# Patient Record
Sex: Male | Born: 2000 | Race: White | Hispanic: No | Marital: Single | Smoking: Former smoker
Health system: Southern US, Community
[De-identification: ages and names within clinical notes are randomized; demographics above are authoritative.]

---
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
## 2021-10-28 ENCOUNTER — Emergency Department
Admission: EM | Admit: 2021-10-28 | Discharge: 2021-10-28 | Disposition: A | Discharge status: Home / Self Care | Payer: Managed Care, Other (non HMO) | Attending: Emergency Medicine | Admitting: Emergency Medicine

## 2021-10-28 ENCOUNTER — Encounter: Payer: Self-pay | Admitting: Emergency Medicine

## 2021-10-28 ENCOUNTER — Emergency Department: Payer: Managed Care, Other (non HMO)

## 2021-10-28 ENCOUNTER — Other Ambulatory Visit: Payer: Self-pay

## 2021-10-28 DIAGNOSIS — R110 Nausea: Secondary | ICD-10-CM | POA: Diagnosis not present

## 2021-10-28 DIAGNOSIS — N50812 Left testicular pain: Secondary | ICD-10-CM | POA: Diagnosis not present

## 2021-10-28 DIAGNOSIS — N200 Calculus of kidney: Secondary | ICD-10-CM

## 2021-10-28 LAB — CBC WITH DIFFERENTIAL/PLATELET
Abs Immature Granulocytes: 0.01 10*3/uL (ref 0.00–0.07)
Basophils Absolute: 0.1 10*3/uL (ref 0.0–0.1)
Basophils Relative: 1 %
Eosinophils Absolute: 0.1 10*3/uL (ref 0.0–0.5)
Eosinophils Relative: 1 %
HCT: 44.8 % (ref 39.0–52.0)
Hemoglobin: 15.2 g/dL (ref 13.0–17.0)
Immature Granulocytes: 0 %
Lymphocytes Relative: 31 %
Lymphs Abs: 2.3 10*3/uL (ref 0.7–4.0)
MCH: 29.2 pg (ref 26.0–34.0)
MCHC: 33.9 g/dL (ref 30.0–36.0)
MCV: 86 fL (ref 80.0–100.0)
Monocytes Absolute: 0.5 10*3/uL (ref 0.1–1.0)
Monocytes Relative: 7 %
Neutro Abs: 4.6 10*3/uL (ref 1.7–7.7)
Neutrophils Relative %: 60 %
Platelets: 256 10*3/uL (ref 150–400)
RBC: 5.21 MIL/uL (ref 4.22–5.81)
RDW: 12.1 % (ref 11.5–15.5)
WBC: 7.6 10*3/uL (ref 4.0–10.5)
nRBC: 0 % (ref 0.0–0.2)

## 2021-10-28 LAB — CHLAMYDIA/NGC RT PCR (ARMC ONLY)
Chlamydia Tr: NOT DETECTED
N gonorrhoeae: NOT DETECTED

## 2021-10-28 LAB — BASIC METABOLIC PANEL
Anion gap: 11 (ref 5–15)
BUN: 15 mg/dL (ref 6–20)
CO2: 22 mmol/L (ref 22–32)
Calcium: 10.2 mg/dL (ref 8.9–10.3)
Chloride: 107 mmol/L (ref 98–111)
Creatinine, Ser: 1.13 mg/dL (ref 0.61–1.24)
GFR, Estimated: >60 mL/min (ref >60)
Glucose, Bld: 145 mg/dL — ABNORMAL HIGH (ref 70–99)
Potassium: 3.9 mmol/L (ref 3.5–5.1)
Sodium: 140 mmol/L (ref 135–145)

## 2021-10-28 LAB — URINALYSIS, ROUTINE W REFLEX MICROSCOPIC
Bacteria, UA: NONE SEEN
Bilirubin Urine: NEGATIVE
Glucose, UA: NEGATIVE mg/dL
Ketones, ur: NEGATIVE mg/dL
Leukocytes,Ua: NEGATIVE
Nitrite: NEGATIVE
Protein, ur: 30 mg/dL — AB
RBC / HPF: >50 RBC/hpf — ABNORMAL HIGH (ref 0–5)
Specific Gravity, Urine: 1.021 (ref 1.005–1.030)
pH: 9 — ABNORMAL HIGH (ref 5.0–8.0)

## 2021-10-28 MED ORDER — KETOROLAC TROMETHAMINE 15 MG/ML IJ SOLN
15.0000 mg | Freq: Once | INTRAMUSCULAR | Status: AC
  Administered 2021-10-28: 15 mg via INTRAVENOUS
  Filled 2021-10-28: qty 1

## 2021-10-28 MED ORDER — OXYCODONE HCL 5 MG PO TABS
5.0000 mg | ORAL_TABLET | Freq: Once | ORAL | Status: AC
  Administered 2021-10-28: 5 mg via ORAL
  Filled 2021-10-28: qty 1

## 2021-10-28 MED ORDER — ONDANSETRON HCL 4 MG/2ML IJ SOLN
4.0000 mg | Freq: Once | INTRAMUSCULAR | Status: AC
Start: 2021-10-28 — End: 2021-10-28
  Administered 2021-10-28: 4 mg via INTRAVENOUS
  Filled 2021-10-28: qty 2

## 2021-10-28 MED ORDER — HYDROMORPHONE HCL 1 MG/ML IJ SOLN
0.5000 mg | Freq: Once | INTRAMUSCULAR | Status: AC
  Administered 2021-10-28: 0.5 mg via INTRAVENOUS
  Filled 2021-10-28: qty 0.5

## 2021-10-28 MED ORDER — TAMSULOSIN HCL 0.4 MG PO CAPS: 0.4000 mg | ORAL_CAPSULE | Freq: Every day | ORAL | 0 refills | Status: AC

## 2021-10-28 MED ORDER — SODIUM CHLORIDE 0.9 % IV BOLUS
1000.0000 mL | Freq: Once | INTRAVENOUS | Status: AC
  Administered 2021-10-28: 1000 mL via INTRAVENOUS

## 2021-10-28 MED ORDER — DROPERIDOL 2.5 MG/ML IJ SOLN
1.2500 mg | Freq: Once | INTRAMUSCULAR | Status: AC
  Administered 2021-10-28: 1.25 mg via INTRAVENOUS
  Filled 2021-10-28: qty 2

## 2021-10-28 MED ORDER — ONDANSETRON 4 MG PO TBDP: 4.0000 mg | ORAL_TABLET | Freq: Three times a day (TID) | ORAL | 0 refills | Status: AC | PRN

## 2021-10-28 MED ORDER — OXYCODONE HCL 5 MG PO TABS: 5.0000 mg | ORAL_TABLET | Freq: Four times a day (QID) | ORAL | 0 refills | Status: AC | PRN

## 2021-10-28 NOTE — Discharge Instructions (Addendum)
Follow up Friday at 115 with urology Dr Apolinar Junes at her [facility] location    You have a kidney stone. See report below.   Take ibuprofen 600mg  every 8 hours daily (as long as you are not on any other blood thinners or have kidney disease) Take tylenol 1g every 8 hours daily. Take oxycodone for breakthrough pain. Do not drive, work, or operate machinery while on this.  Take zofran to help with nausea. Take Flomax to help dilate uretha. Call urology number above to schedule outpatient appointment. Return to ED for fevers, unable to keep food down, or any other concerns.     Take oxycodone as prescribed. Do not drink alcohol, drive or participate in any other potentially dangerous activities while taking this medication as it may make you sleepy. Do not take this medication with any other sedating medications, either prescription or over-the-counter. If you were prescribed Percocet or Vicodin, do not take these with acetaminophen (Tylenol) as it is already contained within these medications.  This medication is an opiate (or narcotic) pain medication and can be habit forming. Use it as little as possible to achieve adequate pain control. Do not use or use it with extreme caution if you have a history of opiate abuse or dependence. If you are on a pain contract with your primary care doctor or a pain specialist, be sure to let them know you were prescribed this medication today from the Emergency Department. This medication is intended for your use only - do not give any to anyone else and keep it in a secure place where nobody else, especially children, have access to it.    IMPRESSION: 1. 3-4 mm proximal to mid LEFT ureteral calculus with mild fullness of intrarenal collecting systems and ureter without current frank signs of hydronephrosis. Also associated with mild perinephric stranding. Correlate with urinalysis 2. Tiny 2 mm calculus also in the upper pole of the LEFT kidney.

## 2021-10-28 NOTE — ED Notes (Signed)
Dr. Sung at the bedside °

## 2021-10-28 NOTE — ED Triage Notes (Signed)
Patient ambulatory to triage with steady gait, without difficulty, appears uncomfortable; pt reports testicular pain this morning with no accomp symptoms; denies hx of same

## 2021-10-28 NOTE — ED Notes (Signed)
US at the bedside

## 2021-10-28 NOTE — ED Provider Notes (Signed)
[facility] Provider Note    Event Date/Time   First MD Initiated Contact with Patient 10/28/21 [phone]     (approximate)   History   Testicle Pain   HPI  Mike Sparks is a 21 y.o. male presents to the ED accompanied by his mother with a chief complaint of left testicular pain.  Patient awoke with pain this morning.  Associated with nausea.  Denies recent illness or trauma.  Denies fever, chills, chest pain, shortness of breath, flank/abdominal pain, hematuria/dysuria, vomiting.     Past Medical History  History reviewed. No pertinent past medical history.   Active Problem List  There are no problems to display for this patient.    Past Surgical History  History reviewed. No pertinent surgical history.   Home Medications   Prior to Admission medications   Not on File     Allergies  Patient has no known allergies.   Family History  No family history on file.   Physical Exam  Triage Vital Signs: ED Triage Vitals  Enc Vitals Group     BP 10/28/21 0645 (!) 138/92     Pulse Rate 10/28/21 0645 66     Resp 10/28/21 0645 (!) 24     Temp 10/28/21 0645 (!) 96.9 F (36.1 C)     Temp Source 10/28/21 0645 Oral     SpO2 10/28/21 0645 99 %     Weight 10/28/21 0631 285 lb (129.3 kg)     Height 10/28/21 0631 6\' 5"  (1.956 m)     Head Circumference --      Peak Flow --      Pain Score 10/28/21 0631 10     Pain Loc --      Pain Edu? --      Excl. in GC? --     Updated Vital Signs: BP (!) 138/92 (BP Location: Left Arm)   Pulse 66   Temp (!) 96.9 F (36.1 C) (Oral)   Resp (!) 24   Ht 6\' 5"  (1.956 m)   Wt 129.3 kg   SpO2 99%   BMI 33.80 kg/m    General: Awake, moderate distress.  CV:  RRR.  Good peripheral perfusion.  Resp:  Increased effort.  CTAB. Abd:  Nontender to light or deep palpation.  No CVAT.  No vesicles.  No distention.  Other:  Diaphoretic.  Circumcised male with bilaterally distended testicles.  Left testicle lays  vertically with mild tenderness to palpation.  No gross swelling.  Strong bilateral cremasteric reflexes.   ED Results / Procedures / Treatments  Labs (all labs ordered are listed, but only abnormal results are displayed) Labs Reviewed  URINALYSIS, ROUTINE W REFLEX MICROSCOPIC  CBC WITH DIFFERENTIAL/PLATELET  BASIC METABOLIC PANEL     EKG  None   RADIOLOGY Scrotal ultrasound is pending   Official radiology report(s): No results found.   PROCEDURES:  Critical Care performed: No  .1-3 Lead EKG Interpretation  Performed by: 10/30/21, MD Authorized by: , MD     Interpretation: normal     ECG rate:  66   ECG rate assessment: normal     Rhythm: sinus rhythm     Ectopy: none     Conduction: normal   Comments:     Patient placed on cardiac monitor to evaluate for arrthymias    MEDICATIONS ORDERED IN ED: Medications  sodium chloride 0.9 % bolus 1,000 mL (has no administration in time range)  ondansetron (ZOFRAN) injection 4 mg (has no administration in time range)  HYDROmorphone (DILAUDID) injection 0.5 mg (has no administration in time range)     IMPRESSION / MDM / ASSESSMENT AND PLAN / ED COURSE  I reviewed the triage vital signs and the nursing notes.                             21 year old male presenting with acute onset left testicular pain.  I have identified this patient to have a potentially life-threatening condition. Differential diagnosis includes, but is not limited to, acute appendicitis, renal colic, testicular torsion, urinary tract infection/pyelonephritis, prostatitis,  epididymitis, diverticulitis, small bowel obstruction or ileus, colitis, abdominal aortic aneurysm, gastroenteritis, hernia, etc.   The patient is on the cardiac monitor to evaluate for evidence of arrhythmia and/or significant heart rate changes.  Will obtain labwork, UA, urgent scrotal US. Administer IV fluids, Dilaudid for pain, Zofran for nausea. Care  transferred to Dr. Fuller Plan at change of shift. Would consider CT renal colic study if [place] is unremarkable.      FINAL CLINICAL IMPRESSION(S) / ED DIAGNOSES   Final diagnoses:  Pain in left testicle     Rx / DC Orders   ED Discharge Orders     None        Note:  This document was prepared using Dragon voice recognition software and may include unintentional dictation errors.   Irean Hong, MD 10/28/21 [phone]

## 2021-10-28 NOTE — ED Provider Notes (Signed)
8:22 AM Assumed care for off going team.   Blood pressure (!) 138/92, pulse 66, temperature (!) 96.9 F (36.1 C), temperature source Oral, resp. rate (!) 24, height 6\' 5"  (1.956 m), weight 129.3 kg, SpO2 99 %.  See their HPI for full report but in brief pending  IMPRESSION: Negative for sonographic signs of testicular torsion.  Normal exam.     8:23 AM reevaluated patient he is got no right lower quadrant tenderness but he does report some left-sided discomfort therefore will get CT renal to evaluate for kidney stone.  Patient still having significant amount of pain so patient given another dose of IV pain medicine  12:24 PM Patient offered avoiding Toradol and follow-up tomorrow with urology for lithotripsy tomorrow versus trialing some Toradol ibuprofen at home and following up on Friday with urology with possible lithotripsy next Thursday if not passing.  He would like to try the tart Toradol and avoid lithotripsy if possible  Discussed return precaution clued fevers, worsening pain or other concerns.  They feel comfortable with discharge home.  I discussed the case with urology Dr. Saturday who will get him follow-up on Friday.  No antibiotics indicated at this time      Sunday, MD 10/28/21 1225

## 2021-10-30 ENCOUNTER — Encounter: Payer: Self-pay | Admitting: Urology

## 2021-10-30 ENCOUNTER — Ambulatory Visit (INDEPENDENT_AMBULATORY_CARE_PROVIDER_SITE_OTHER): Payer: Managed Care, Other (non HMO) | Admitting: Urology

## 2021-10-30 ENCOUNTER — Ambulatory Visit: Payer: Managed Care, Other (non HMO) | Admitting: Urology

## 2021-10-30 VITALS — BP 142/87 | HR 80 | Ht 77.0 in | Wt 286.0 lb

## 2021-10-30 DIAGNOSIS — N200 Calculus of kidney: Secondary | ICD-10-CM | POA: Diagnosis not present

## 2021-11-03 ENCOUNTER — Ambulatory Visit: Payer: Managed Care, Other (non HMO) | Admitting: Urology

## 2021-11-13 ENCOUNTER — Ambulatory Visit: Payer: Managed Care, Other (non HMO) | Admitting: Urology

## 2021-11-13 NOTE — Progress Notes (Deleted)
   Established Patient Office Visit  Subjective   Patient ID: Mike Sparks, male    DOB: 08-05-2000  Age: 21 y.o. MRN: [id number]  No chief complaint on file.   HPI  21 year old male with a left 3 mm proximal ureteral calculus returns today for follow-up.  He was last seen on 10/30/2021.  At that point in time, his pain is migrated down to his left testicle.    ROS    Objective:     There were no vitals taken for this visit. {Vitals History (Optional):23777}  Physical Exam   No results found for any visits on 11/13/21.  {Labs (Optional):23779}  The ASCVD Risk score (Arnett DK, et al., 2019) failed to calculate for the following reasons:   The 2019 ASCVD risk score is only valid for ages 25 to 65    Assessment & Plan:   Problem List Items Addressed This Visit   None   No follow-ups on file.    Vanna Scotland, MD

## 2023-12-12 IMAGING — US US SCROTUM W/ DOPPLER COMPLETE
1 series · 14 of 25 positions shown · non-contrast
Comparison: None Available.

CLINICAL DATA: 21-year-old male presenting with testicular pain on
the LEFT side.

EXAM:
SCROTAL ULTRASOUND
DOPPLER ULTRASOUND OF THE TESTICLES
TECHNIQUE: Complete ultrasound examination of the testicles, epididymis, and
other scrotal structures was performed. Color and spectral Doppler
ultrasound were also utilized to evaluate blood flow to the
testicles.

[Series 1: us scrotum w/doppler · 14 of 27 slices shown]
[im 1/27]
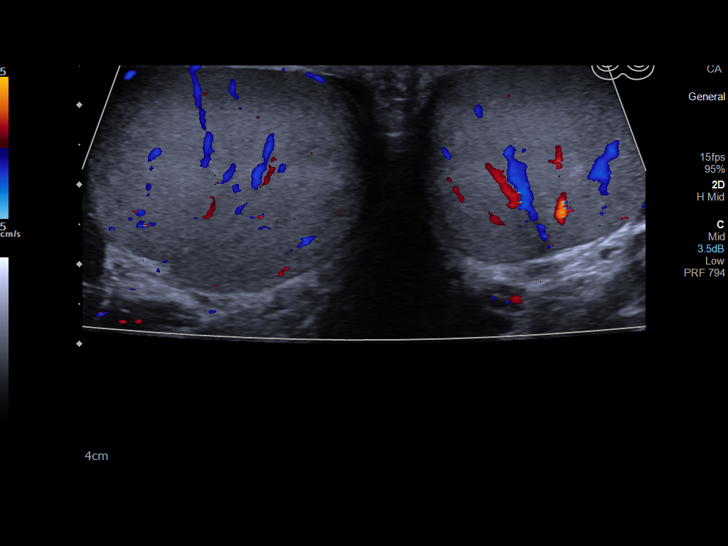
[im 3/27]
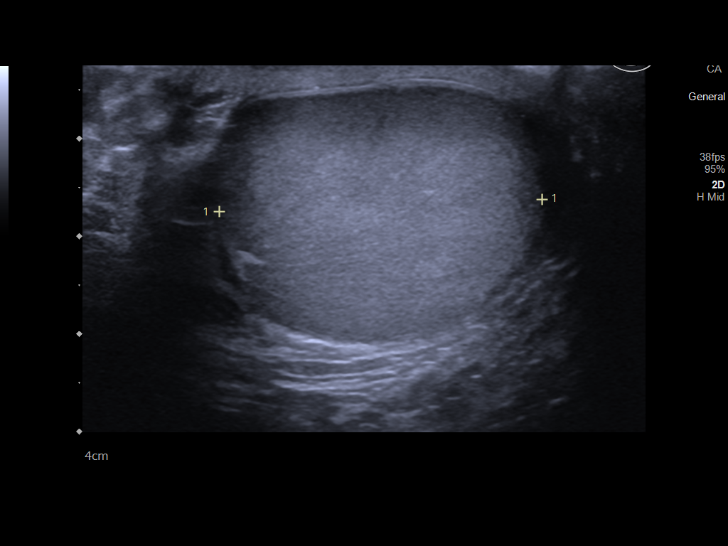
[im 5/27]
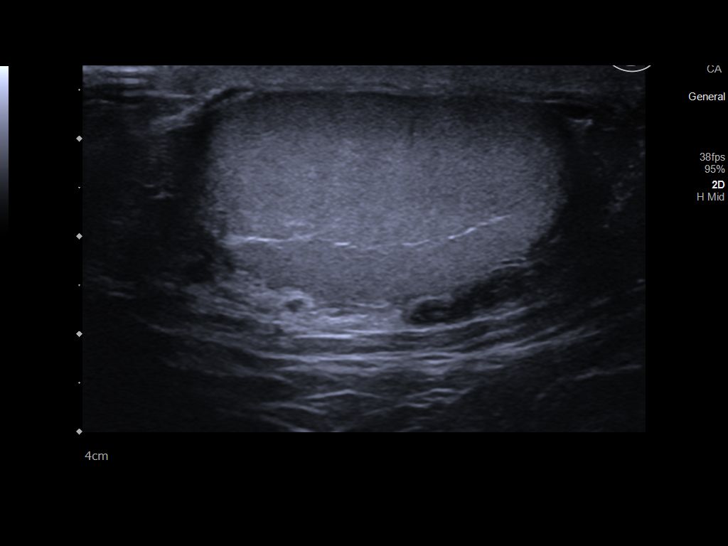
[im 7/27]
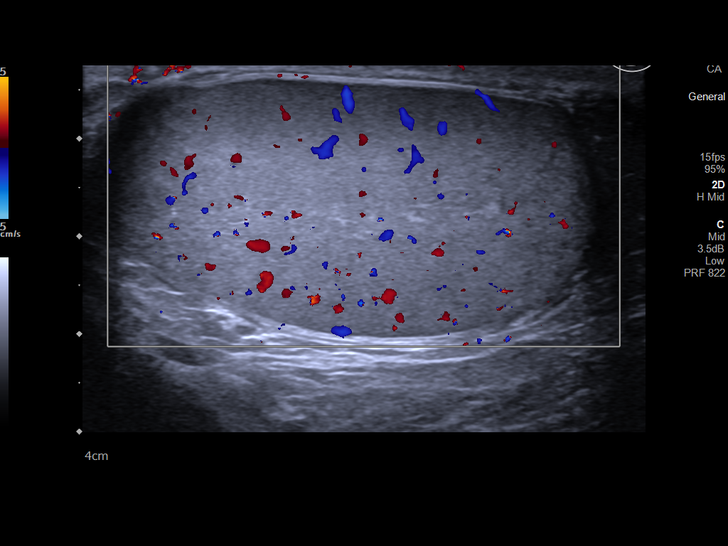
[im 9/27]
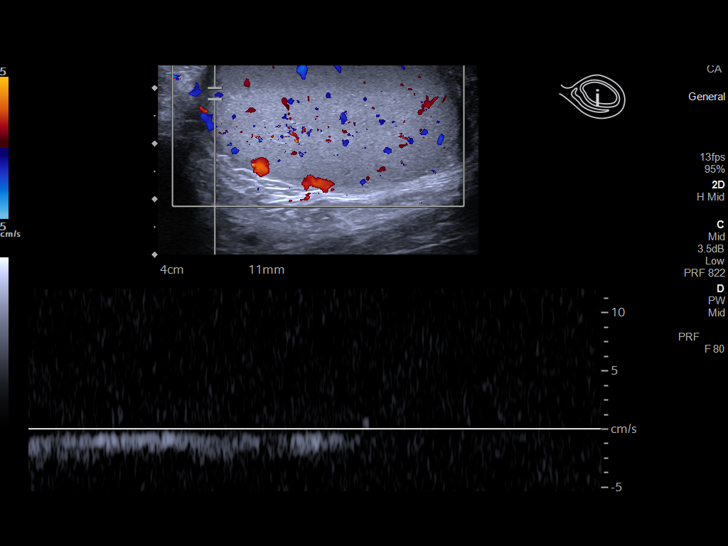
[im 10/27]
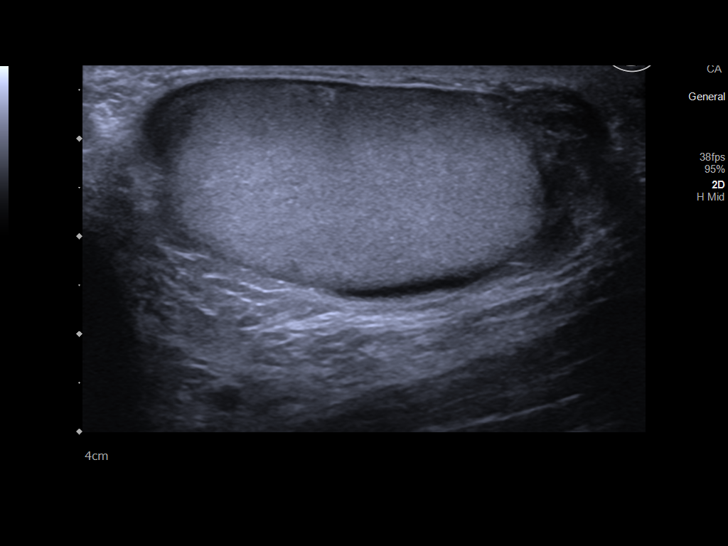
[im 12/27]
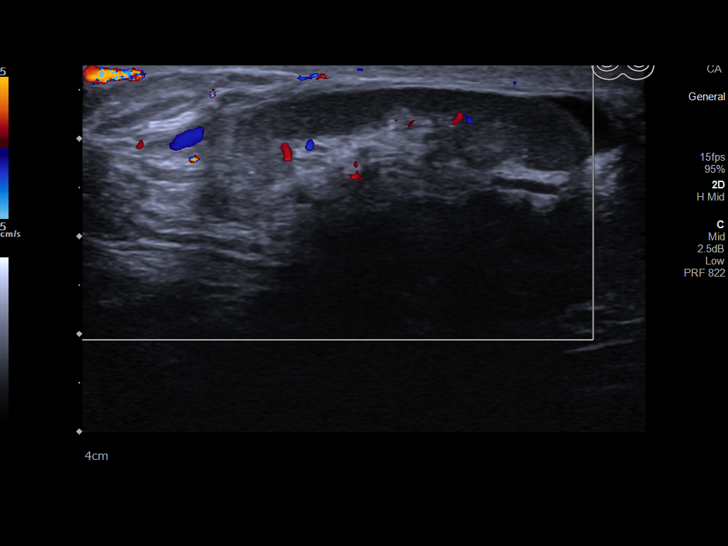
[im 15/27]
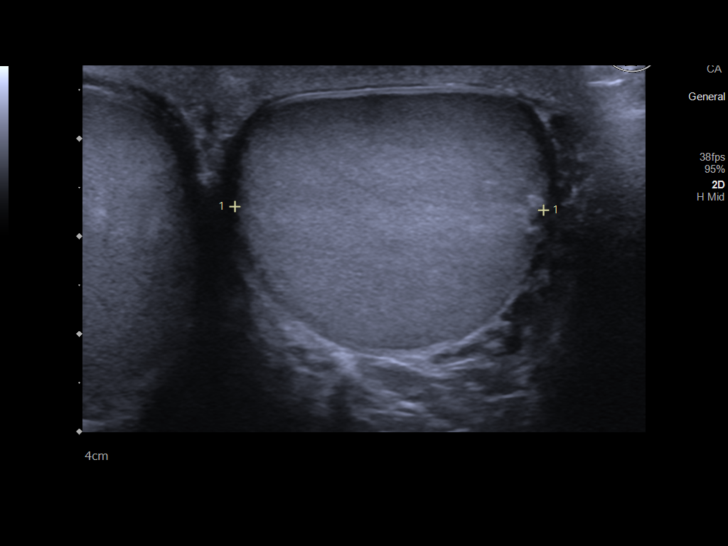
[im 17/27]
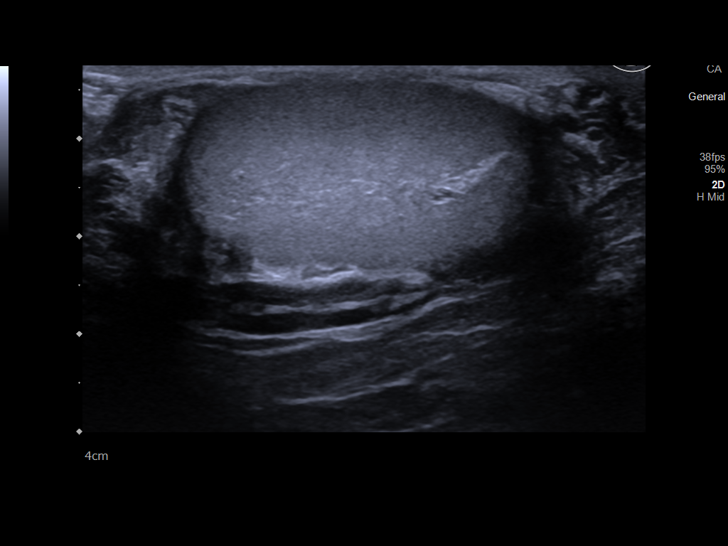
[im 18/27]
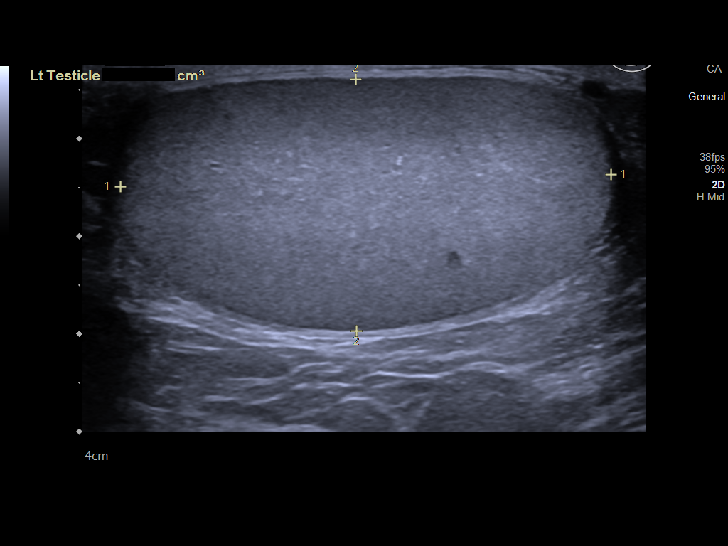
[im 20/27]
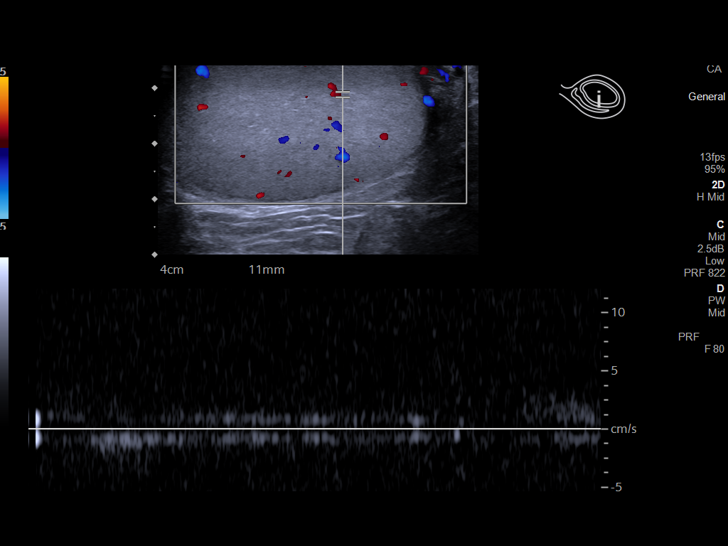
[im 22/27]
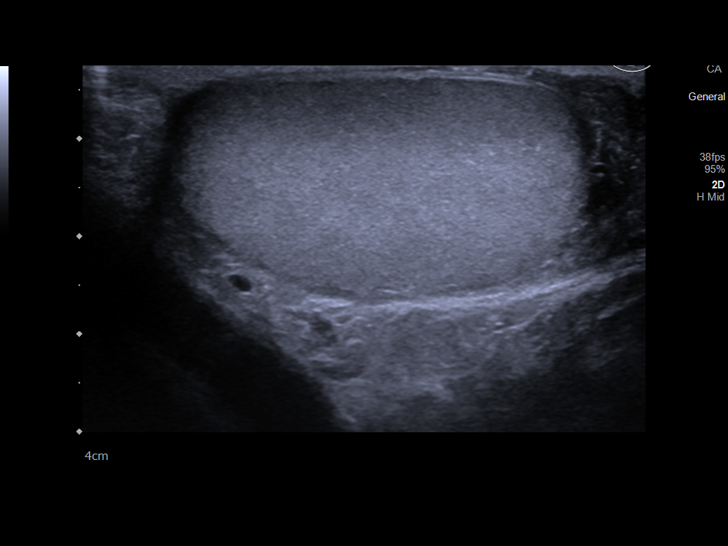
[im 24/27]
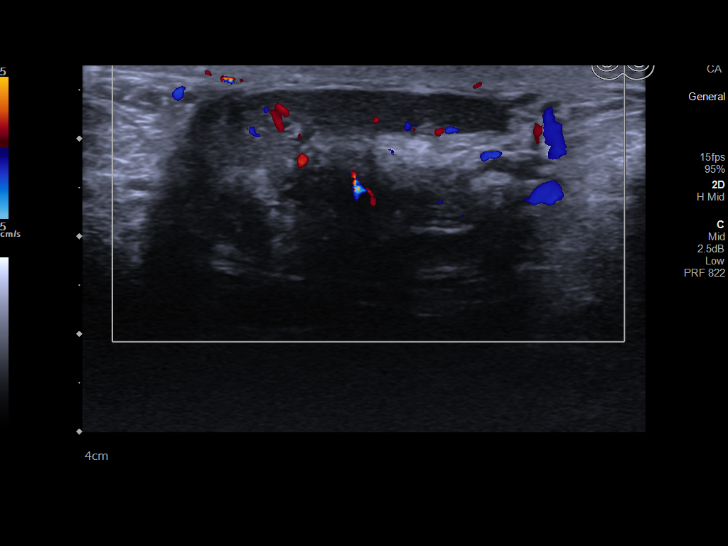
[im 27/27]
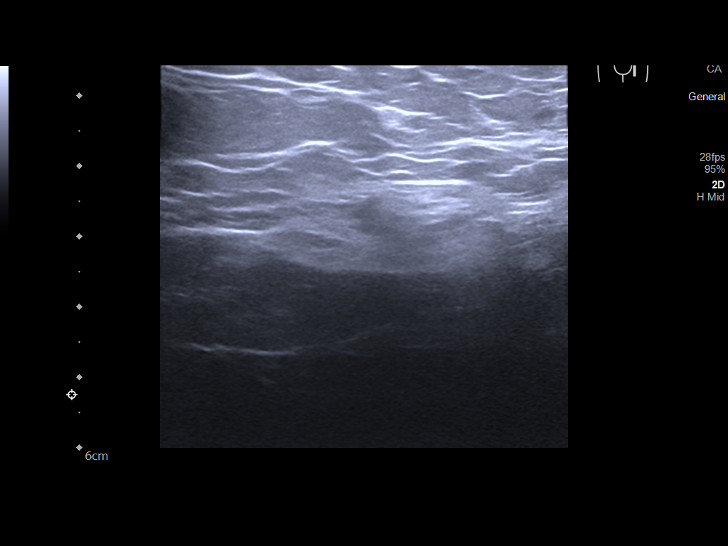

[14 of 25 positions shown; findings below may reference images not displayed]

FINDINGS: Right testicle

Measurements: 5.0 x 2.5 x 3.3 cm. No mass or microlithiasis
visualized.

Left testicle

Measurements: 5.0 x 2.6 x 3.2 cm. No mass or microlithiasis
visualized.

Right epididymis:  Normal in size and appearance.

Left epididymis:  Normal in size and appearance.

Hydrocele:  None visualized.

Varicocele:  None visualized.

Pulsed Doppler interrogation of both testes demonstrates normal low
resistance arterial and venous waveforms bilaterally.
IMPRESSION: Negative for sonographic signs of testicular torsion.  Normal exam.

## 2023-12-12 IMAGING — CT CT RENAL STONE PROTOCOL
2 of 5 series · 16 of 46 positions shown, 18 images · non-contrast
Comparison: None available

CLINICAL DATA: Flank pain, suspected kidney stone.



[Series 4: ap without · axial · non-contrast · 0.80mm/px · z∈[-1032,-596]mm · 13 of 101 slices shown, 15 images]
[im 7/101  soft-tissue]
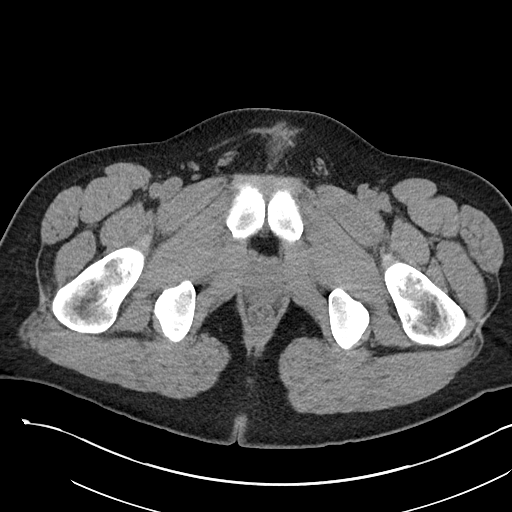
[im 7/101  bone]
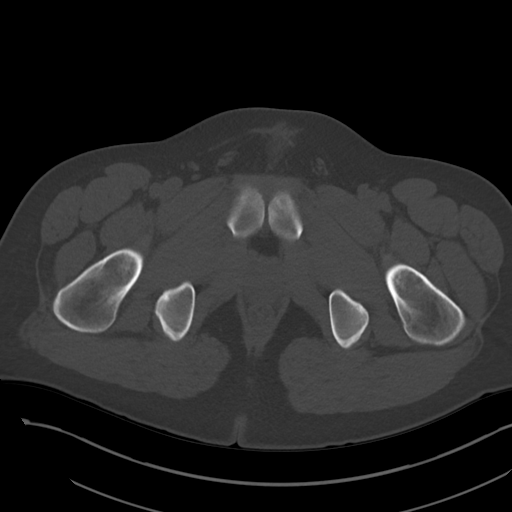
[im 14/101  soft-tissue]
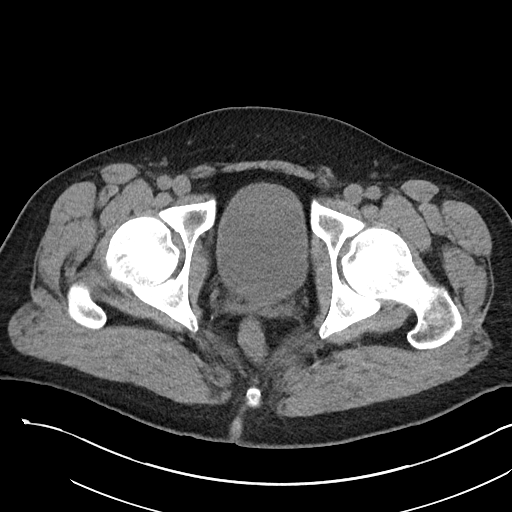
[im 21/101  soft-tissue]
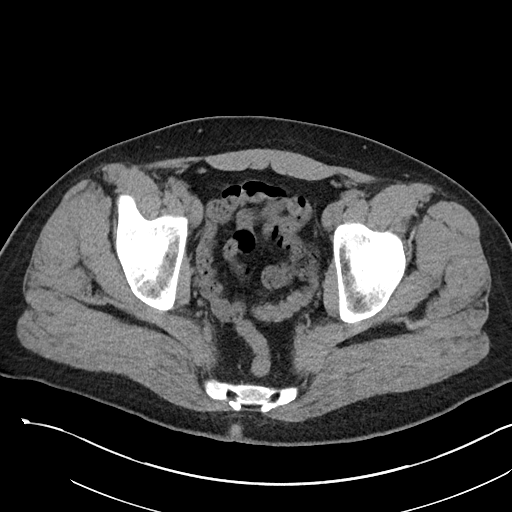
[im 27/101  soft-tissue]
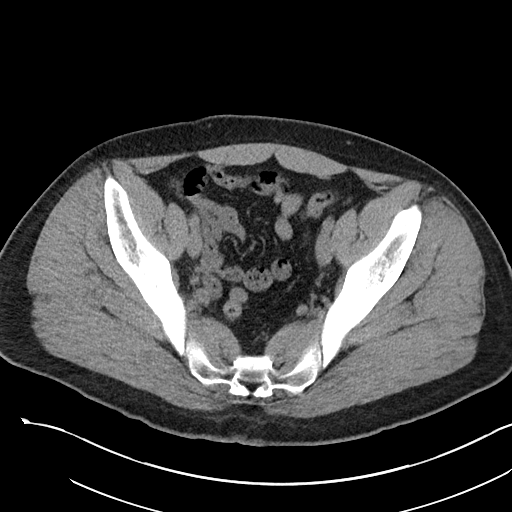
[im 34/101  soft-tissue]
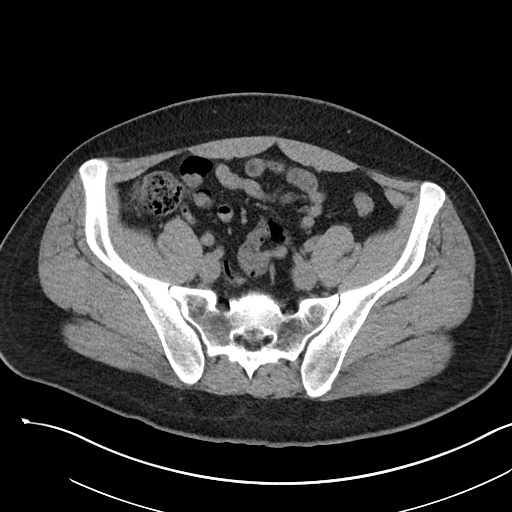
[im 41/101  soft-tissue]
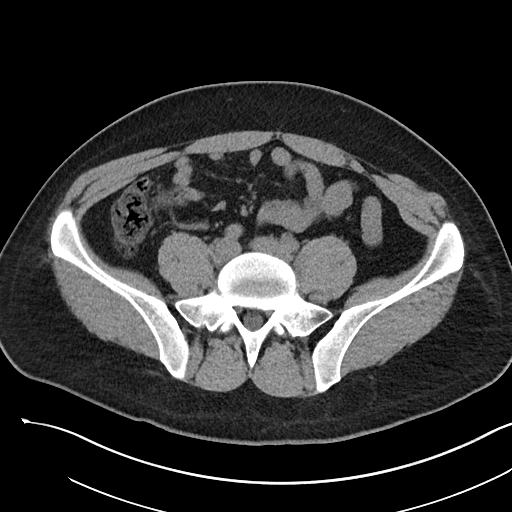
[im 54/101  soft-tissue]
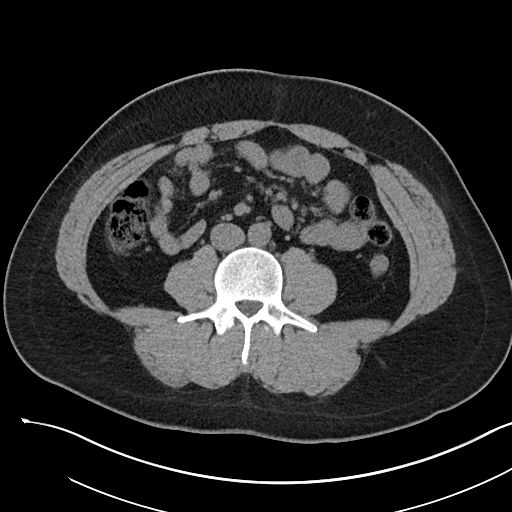
[im 61/101  soft-tissue]
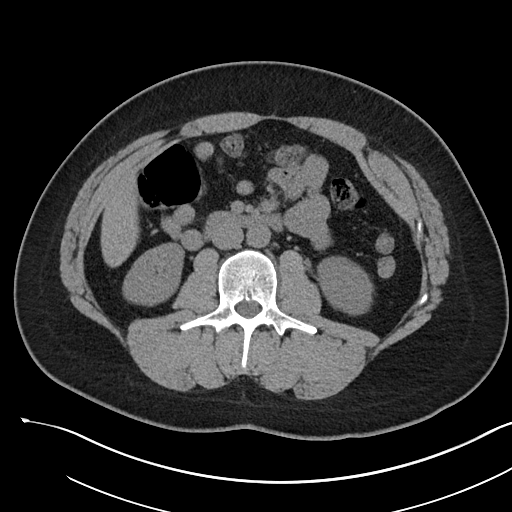
[im 67/101  soft-tissue]
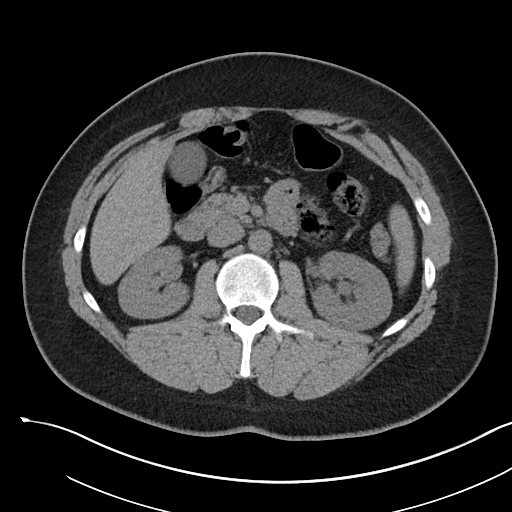
[im 67/101  bone]
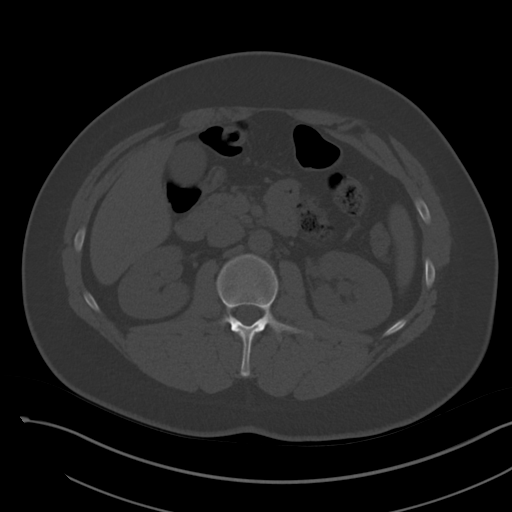
[im 74/101  soft-tissue]
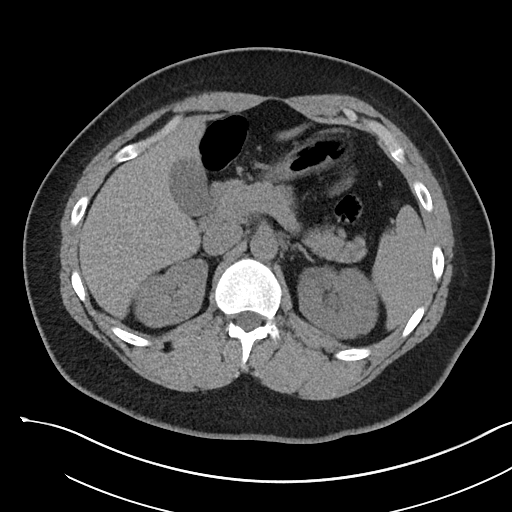
[im 81/101  soft-tissue]
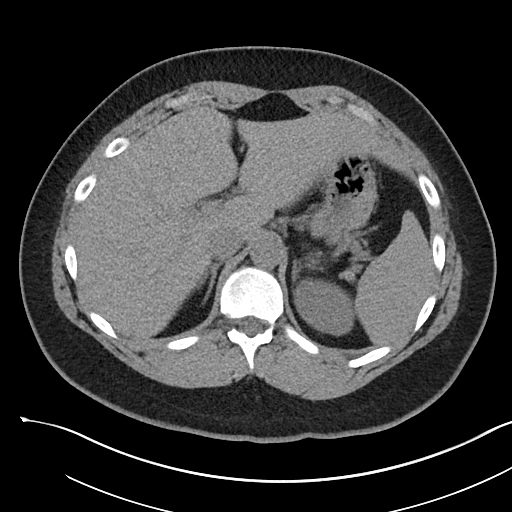
[im 87/101  soft-tissue]
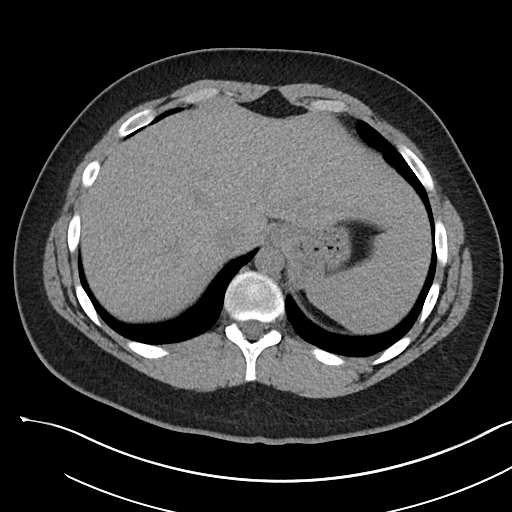
[im 94/101  soft-tissue]
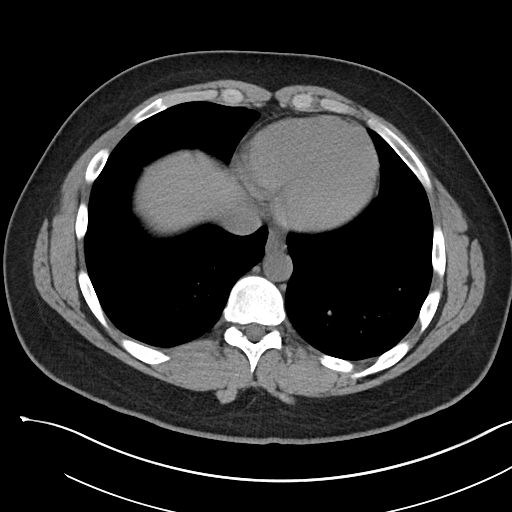

[Series 7: cor · coronal · 0.91mm/px · 3 of 101 slices shown]
[im 34/101  soft-tissue]
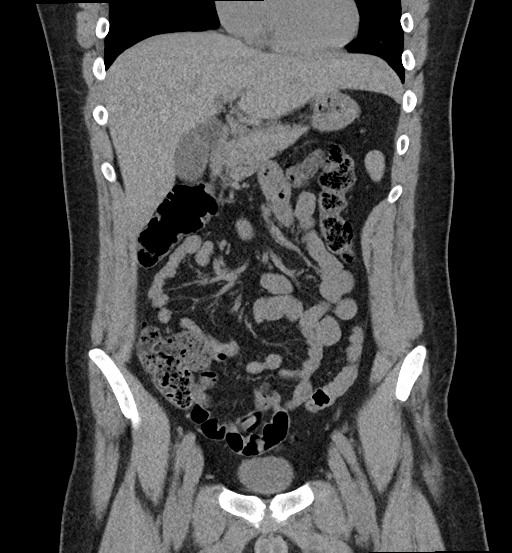
[im 45/101  soft-tissue]
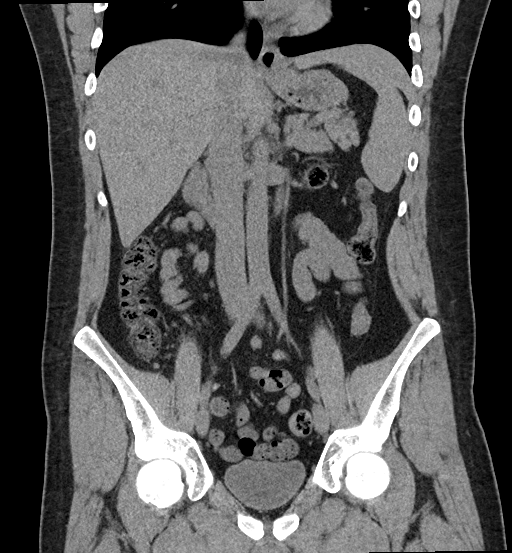
[im 56/101  soft-tissue]
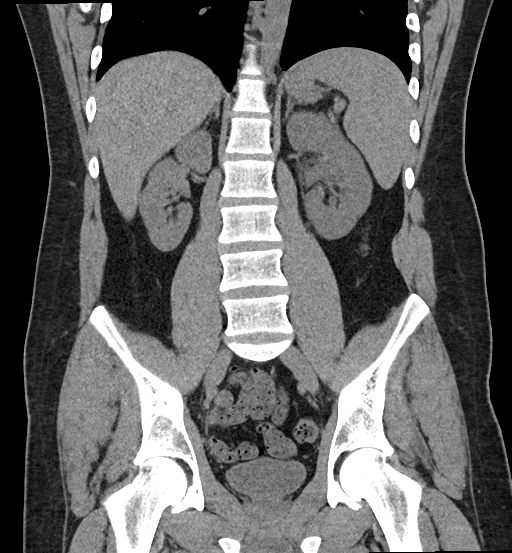

[16 of 46 positions shown; findings below may reference images not displayed]

FINDINGS: Lower chest: Lung bases are clear. No effusion. No consolidative
changes.

Hepatobiliary: Normal noncontrast appearance of liver and biliary
tree.

Pancreas: Normal contour without signs of inflammation.

Spleen: Normal.

Adrenals/Urinary Tract: Adrenal glands are normal.

Mild perinephric stranding and mild fullness of intrarenal
collecting systems and ureter without signs of hydronephrosis due to
a proximal to mid LEFT ureteral calculus measuring 3-4 mm.

Tiny 2 mm calculus also in the upper pole of the LEFT kidney. No
additional ureteral calculi.

Smooth renal contours otherwise. No RIGHT-sided calculi. Urinary
bladder without adjacent stranding.

Stomach/Bowel: Normal appendix.  No acute gastrointestinal process.

Vascular/Lymphatic:

Aorta with smooth contours. IVC with smooth contours. No aneurysmal
dilation of the abdominal aorta. There is no gastrohepatic or
hepatoduodenal ligament lymphadenopathy. No retroperitoneal or
mesenteric lymphadenopathy.

No pelvic sidewall lymphadenopathy.

Retroperitoneal a vascular structures with limited assessment due to
lack of contrast.

Reproductive: Unremarkable.

Other: No ascites.

Musculoskeletal: No acute or significant osseous findings.
IMPRESSION: 1. 3-4 mm proximal to mid LEFT ureteral calculus with mild fullness
of intrarenal collecting systems and ureter without current frank
signs of hydronephrosis. Also associated with mild perinephric
stranding. Correlate with urinalysis
2. Tiny 2 mm calculus also in the upper pole of the LEFT kidney.
